# Patient Record
Sex: Female | Born: 1975 | Race: White | Hispanic: No | Marital: Married
Health system: Southern US, Community
[De-identification: ages and names within clinical notes are randomized; demographics above are authoritative.]

## PROBLEM LIST (undated history)

## (undated) HISTORY — PX: HERNIA REPAIR: SHX51

## (undated) HISTORY — PX: WISDOM TOOTH EXTRACTION: SHX21

---

## 2013-02-23 ENCOUNTER — Ambulatory Visit: Payer: Self-pay | Admitting: Family Medicine

## 2013-02-23 LAB — CBC WITH DIFFERENTIAL/PLATELET
Basophil #: 0.1 10*3/uL (ref 0.0–0.1)
Basophil %: 0.8 %
Eosinophil #: 0 10*3/uL (ref 0.0–0.7)
Eosinophil %: 0.3 %
HCT: 40.6 % (ref 35.0–47.0)
Lymphocyte %: 23.1 %
MCHC: 33.4 g/dL (ref 32.0–36.0)
MCV: 94 fL (ref 80–100)
Monocyte %: 6.8 %
Neutrophil #: 5 10*3/uL (ref 1.4–6.5)
RBC: 4.31 10*6/uL (ref 3.80–5.20)

## 2018-01-09 ENCOUNTER — Other Ambulatory Visit: Payer: Self-pay | Admitting: Obstetrics and Gynecology

## 2018-01-09 DIAGNOSIS — Z1231 Encounter for screening mammogram for malignant neoplasm of breast: Secondary | ICD-10-CM

## 2018-01-13 ENCOUNTER — Ambulatory Visit
Admission: RE | Admit: 2018-01-13 | Discharge: 2018-01-13 | Disposition: A | Discharge status: Home / Self Care | Payer: BC Managed Care – PPO | Source: Ambulatory Visit | Attending: Obstetrics and Gynecology | Admitting: Obstetrics and Gynecology

## 2018-01-13 ENCOUNTER — Encounter (INDEPENDENT_AMBULATORY_CARE_PROVIDER_SITE_OTHER): Payer: Self-pay

## 2018-01-13 DIAGNOSIS — Z1231 Encounter for screening mammogram for malignant neoplasm of breast: Secondary | ICD-10-CM | POA: Diagnosis present

## 2019-09-23 ENCOUNTER — Other Ambulatory Visit: Payer: Self-pay | Admitting: Obstetrics and Gynecology

## 2019-09-23 DIAGNOSIS — Z1231 Encounter for screening mammogram for malignant neoplasm of breast: Secondary | ICD-10-CM

## 2019-11-07 IMAGING — MG MM DIGITAL SCREENING BILAT W/ TOMO W/ CAD
8 series · 9 of 24 positions shown · non-contrast
Comparison: Previous exam(s).

CLINICAL DATA: Screening.

EXAM:
DIGITAL SCREENING BILATERAL MAMMOGRAM WITH TOMO AND CAD

[R CC synth-2D]
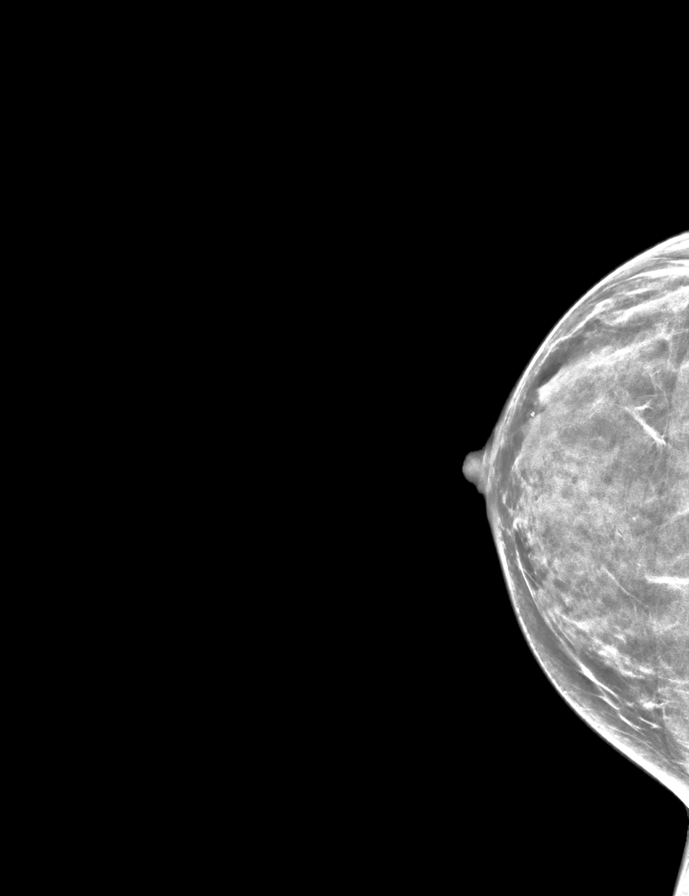

[L MLO synth-2D]
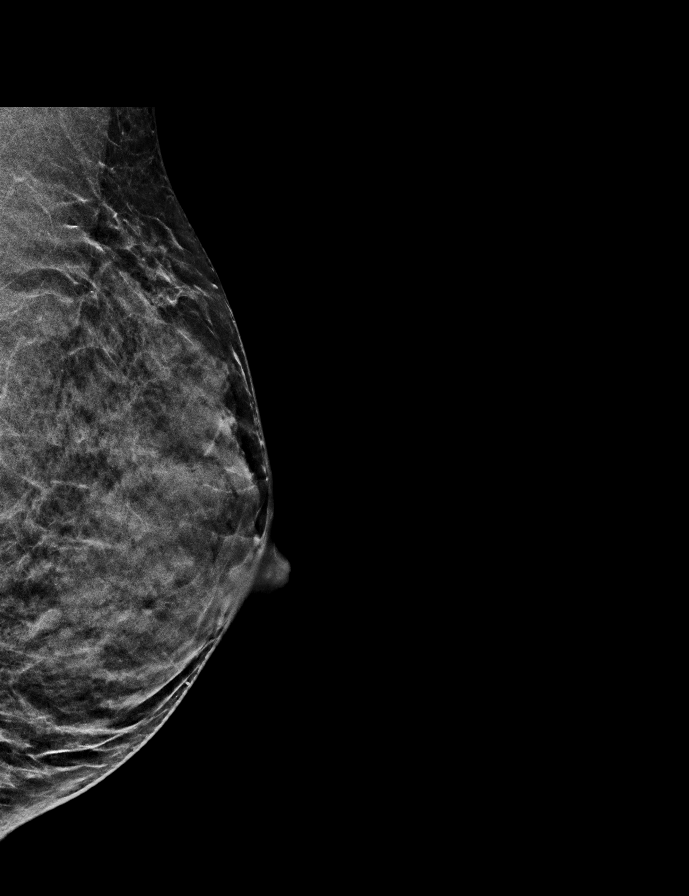

[R MLO synth-2D]
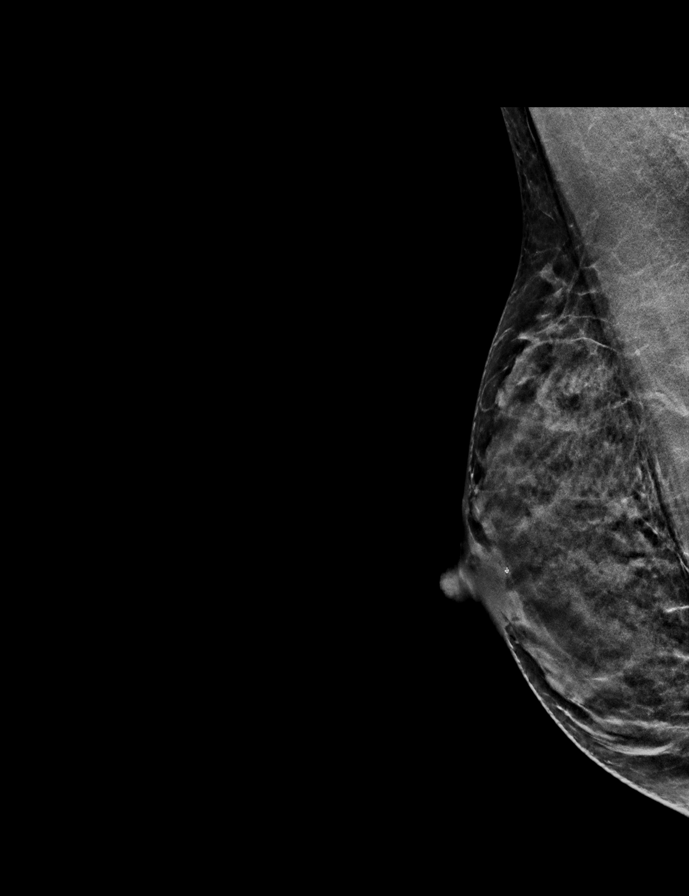

[L CC synth-2D]
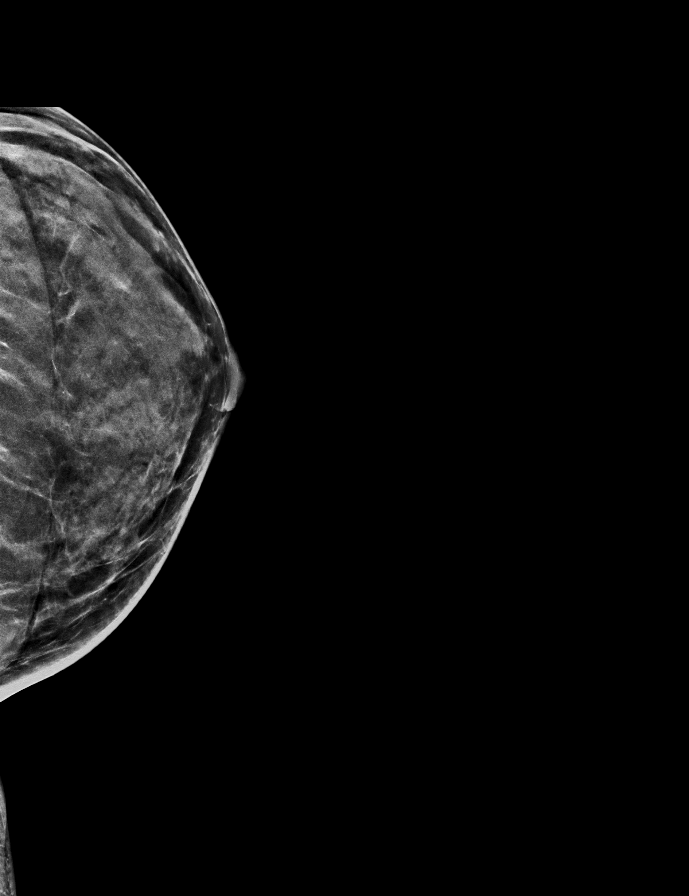

[L MLO tomo · 2 of 39 frames shown]
[frame 13/39]
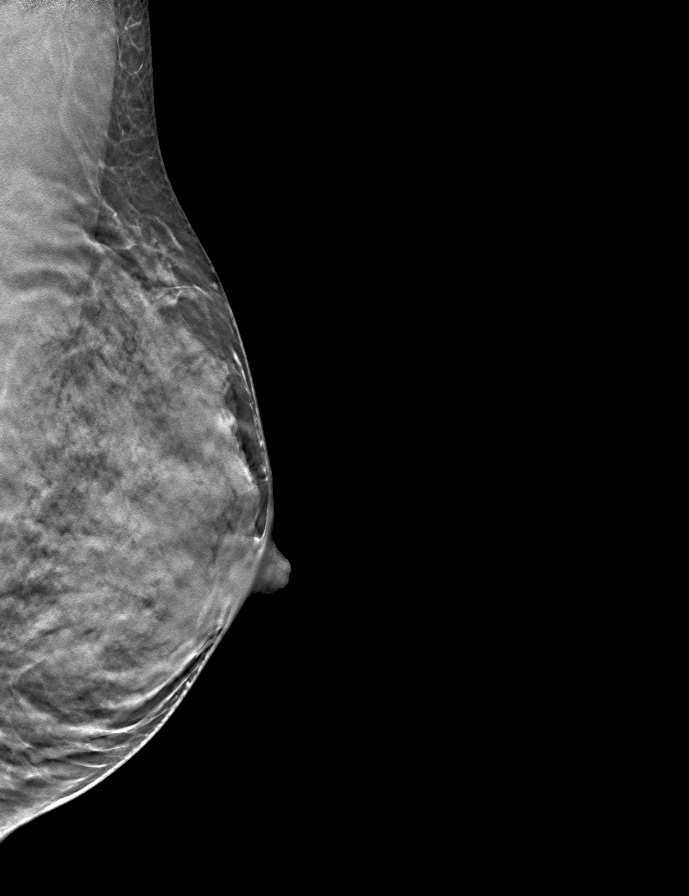
[frame 20/39]
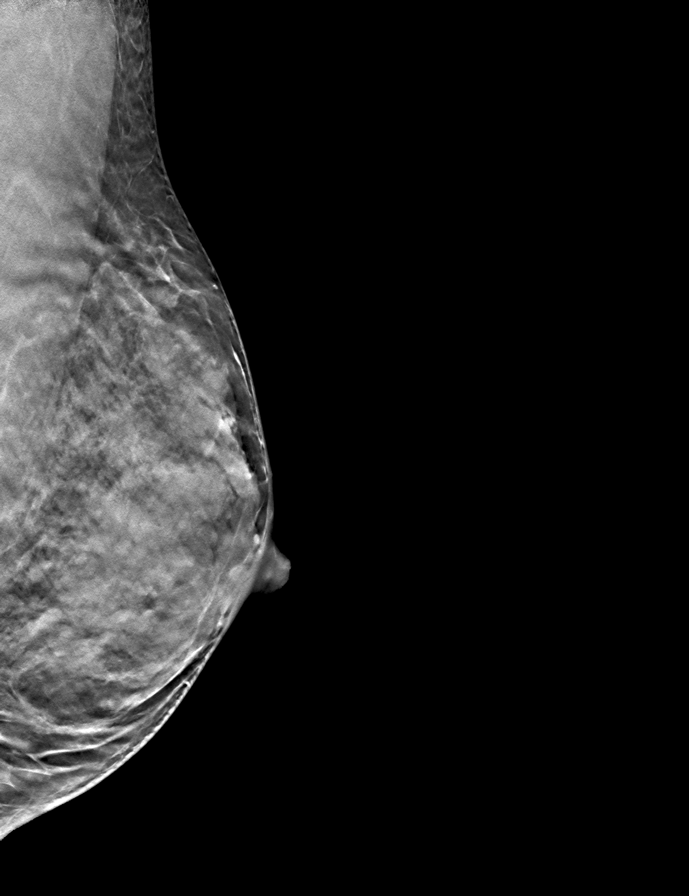

[R CC tomo · tomo slice 24/47.0]
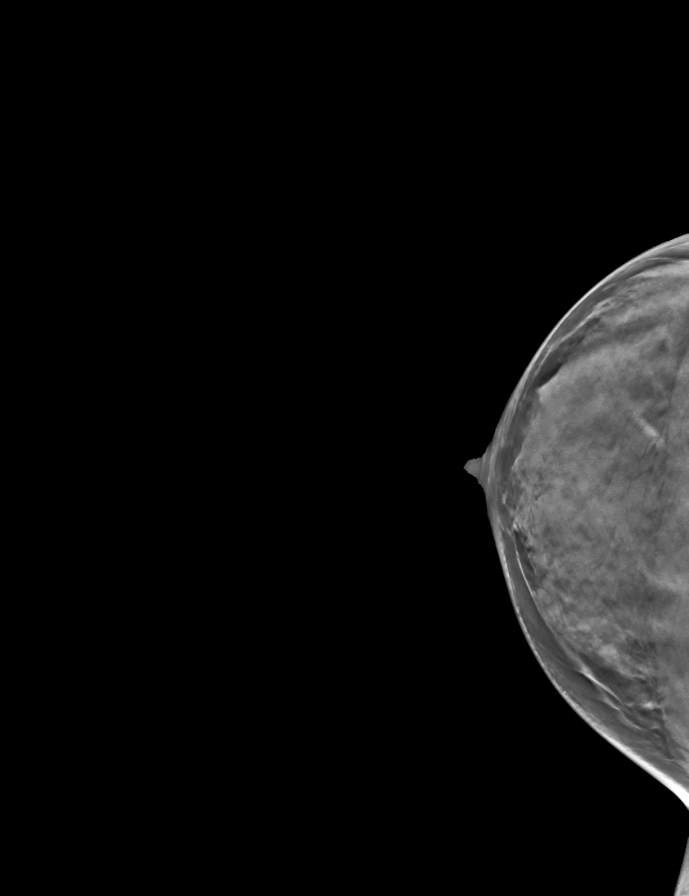

[L CC tomo · tomo slice 25/48.0]
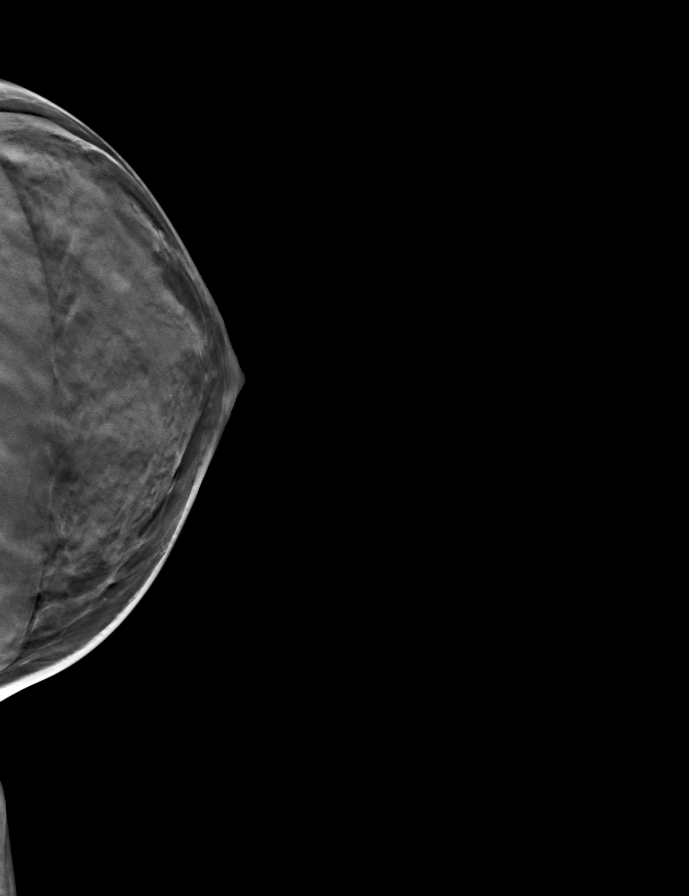

[R MLO tomo · tomo slice 23/45.0]
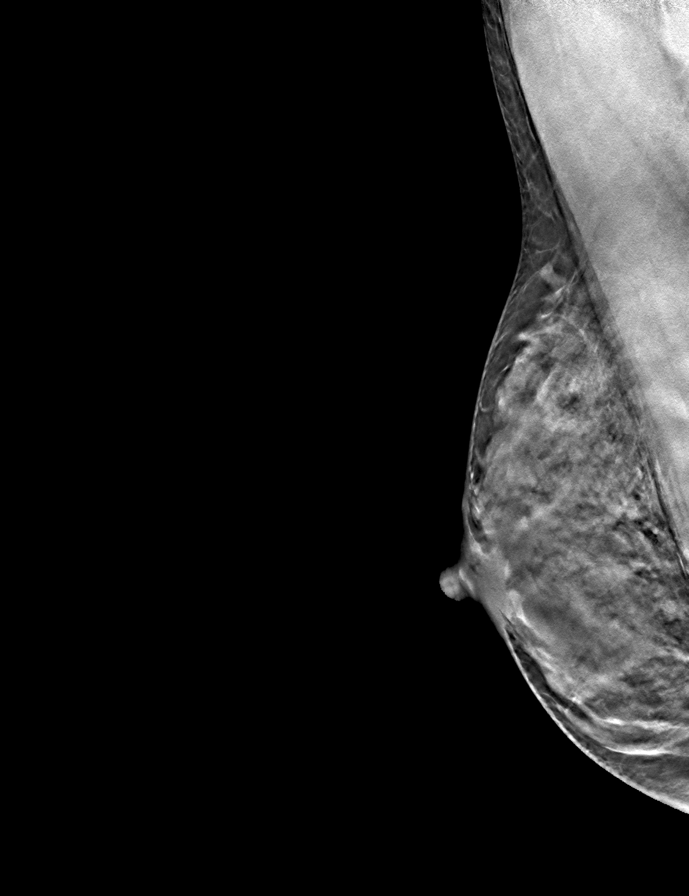

[9 of 24 positions shown; findings below may reference images not displayed]

ACR Breast Density Category d: The breast tissue is extremely dense,
which lowers the sensitivity of mammography.
FINDINGS: There are no findings suspicious for malignancy. Images were
processed with CAD.
IMPRESSION: No mammographic evidence of malignancy. A result letter of this
screening mammogram will be mailed directly to the patient.

RECOMMENDATION:
Screening mammogram in one year. (Code:RA-I-AVB)

BI-RADS CATEGORY  1: Negative.

## 2022-11-14 ENCOUNTER — Other Ambulatory Visit: Payer: Self-pay | Admitting: *Deleted

## 2022-11-14 ENCOUNTER — Telehealth: Payer: Self-pay | Admitting: *Deleted

## 2022-11-14 DIAGNOSIS — Z1211 Encounter for screening for malignant neoplasm of colon: Secondary | ICD-10-CM

## 2022-11-14 NOTE — Telephone Encounter (Signed)
Gastroenterology Pre-Procedure Review  Request Date: 03/10/2023 Requesting Physician: Dr. Allegra Lai  PATIENT REVIEW QUESTIONS: The patient responded to the following health history questions as indicated:    1. Are you having any GI issues? no 2. Do you have a personal history of Polyps? no 3. Do you have a family history of Colon Cancer or Polyps? no 4. Diabetes Mellitus? no 5. Joint replacements in the past 12 months?no 6. Major health problems in the past 3 months?no 7. Any artificial heart valves, MVP, or defibrillator?no    MEDICATIONS & ALLERGIES:    Patient reports the following regarding taking any anticoagulation/antiplatelet therapy:   Plavix, Coumadin, Eliquis, Xarelto, Lovenox, Pradaxa, Brilinta, or Effient? no Aspirin? no  Patient confirms/reports the following medications:  Current Outpatient Medications  Medication Sig Dispense Refill   TRI-ESTARYLLA 0.18/0.215/0.25 MG-35 MCG tablet Take 1 tablet by mouth every morning.     No current facility-administered medications for this visit.    Patient confirms/reports the following allergies:  Not on File  No orders of the defined types were placed in this encounter.   AUTHORIZATION INFORMATION Primary Insurance: 1D#: Group #:  Secondary Insurance: 1D#: Group #:  SCHEDULE INFORMATION: Date: 03/10/2023 Time: Location:  [facility]

## 2023-02-24 ENCOUNTER — Other Ambulatory Visit: Payer: Self-pay | Admitting: *Deleted

## 2023-02-24 MED ORDER — NA SULFATE-K SULFATE-MG SULF 17.5-3.13-1.6 GM/177ML PO SOLN: 1.0000 | Freq: Once | ORAL | 0 refills | Status: AC

## 2023-03-03 ENCOUNTER — Telehealth: Payer: Self-pay | Admitting: Gastroenterology

## 2023-03-03 NOTE — Telephone Encounter (Signed)
Spoken to rep and made her confirm patient's information before I gave the codes.

## 2023-03-03 NOTE — Telephone Encounter (Signed)
Ty called in from [employer] to get the CPT code and Dx code.

## 2023-03-10 ENCOUNTER — Ambulatory Visit: Payer: BC Managed Care – PPO | Admitting: General Practice

## 2023-03-10 ENCOUNTER — Ambulatory Visit
Admission: RE | Admit: 2023-03-10 | Discharge: 2023-03-10 | Disposition: A | Discharge status: Home / Self Care | Payer: BC Managed Care – PPO | Attending: Gastroenterology | Admitting: Gastroenterology

## 2023-03-10 ENCOUNTER — Encounter
Admission: RE | Disposition: A | Discharge status: Home / Self Care | Payer: Self-pay | Source: Home / Self Care | Attending: Gastroenterology

## 2023-03-10 ENCOUNTER — Encounter: Payer: Self-pay | Admitting: Gastroenterology

## 2023-03-10 DIAGNOSIS — Z1211 Encounter for screening for malignant neoplasm of colon: Secondary | ICD-10-CM | POA: Diagnosis not present

## 2023-03-10 DIAGNOSIS — D128 Benign neoplasm of rectum: Secondary | ICD-10-CM | POA: Diagnosis not present

## 2023-03-10 DIAGNOSIS — K621 Rectal polyp: Secondary | ICD-10-CM

## 2023-03-10 HISTORY — PX: COLONOSCOPY WITH PROPOFOL: SHX5780

## 2023-03-10 HISTORY — PX: HOT HEMOSTASIS: SHX5433

## 2023-03-10 HISTORY — PX: POLYPECTOMY: SHX5525

## 2023-03-10 LAB — POCT PREGNANCY, URINE: Preg Test, Ur: NEGATIVE

## 2023-03-10 SURGERY — COLONOSCOPY WITH PROPOFOL
Anesthesia: General

## 2023-03-10 MED ORDER — LIDOCAINE HCL (PF) 2 % IJ SOLN
INTRAMUSCULAR | Status: DC | PRN
  Administered 2023-03-10: 50 mg via INTRADERMAL

## 2023-03-10 MED ORDER — PROPOFOL 500 MG/50ML IV EMUL
INTRAVENOUS | Status: DC | PRN
  Administered 2023-03-10: 100 mg via INTRAVENOUS
  Administered 2023-03-10: 150 ug/kg/min via INTRAVENOUS

## 2023-03-10 MED ORDER — LIDOCAINE HCL (PF) 2 % IJ SOLN
INTRAMUSCULAR | Status: AC
  Filled 2023-03-10: qty 5

## 2023-03-10 MED ORDER — SODIUM CHLORIDE 0.9 % IV SOLN: INTRAVENOUS | Status: DC

## 2023-03-10 MED ORDER — PROPOFOL 1000 MG/100ML IV EMUL
INTRAVENOUS | Status: AC
  Filled 2023-03-10: qty 100

## 2023-03-10 NOTE — Op Note (Signed)
[facility] Gastroenterology Patient Name: Erica Maxwell Procedure Date: 03/10/2023 7:25 AM MRN: [id number] Account #: [id number] Date of Birth: 1976/03/21 Admit Type: Outpatient Age: 47 Room: [facility] ENDO ROOM 4 Gender: Female Note Status: Finalized Instrument Name: Peds Colonoscope [id number] Procedure:             Colonoscopy Indications:           Screening for colorectal malignant neoplasm, This is                         the patient's first colonoscopy Providers:             Toney Reil MD, MD Referring MD:          Toney Reil MD, MD (Referring MD) Medicines:             General Anesthesia Complications:         No immediate complications. Estimated blood loss: None. Procedure:             Pre-Anesthesia Assessment:                        - Prior to the procedure, a History and Physical was                         performed, and patient medications and allergies were                         reviewed. The patient is competent. The risks and                         benefits of the procedure and the sedation options and                         risks were discussed with the patient. All questions                         were answered and informed consent was obtained.                         Patient identification and proposed procedure were                         verified by the physician, the nurse, the                         anesthesiologist, the anesthetist and the technician                         in the pre-procedure area in the procedure room in the                         endoscopy suite. Mental Status Examination: alert and                         oriented. Airway Examination: normal oropharyngeal                         airway and neck mobility. Respiratory Examination:  clear to auscultation. CV Examination: normal.                         Prophylactic Antibiotics: The patient does not require                          prophylactic antibiotics. Prior Anticoagulants: The                         patient has taken no anticoagulant or antiplatelet                         agents. ASA Grade Assessment: I - A normal, healthy                         patient. After reviewing the risks and benefits, the                         patient was deemed in satisfactory condition to                         undergo the procedure. The anesthesia plan was to use                         general anesthesia. Immediately prior to                         administration of medications, the patient was                         re-assessed for adequacy to receive sedatives. The                         heart rate, respiratory rate, oxygen saturations,                         blood pressure, adequacy of pulmonary ventilation, and                         response to care were monitored throughout the                         procedure. The physical status of the patient was                         re-assessed after the procedure.                        After obtaining informed consent, the colonoscope was                         passed under direct vision. Throughout the procedure,                         the patient's blood pressure, pulse, and oxygen                         saturations were monitored continuously. The  Colonoscope was introduced through the anus and                         advanced to the the cecum, identified by appendiceal                         orifice and ileocecal valve. The colonoscopy was                         performed without difficulty. The patient tolerated                         the procedure well. The quality of the bowel                         preparation was evaluated using the BBPS [facility] Bowel                         Preparation Scale) with scores of: Right Colon = 3,                         Transverse Colon = 3 and Left Colon = 3 (entire mucosa                         seen  well with no residual staining, small fragments                         of stool or opaque liquid). The total BBPS score                         equals 9. The ileocecal valve, appendiceal orifice,                         and rectum were photographed. Findings:      The perianal and digital rectal examinations were normal. Pertinent       negatives include normal sphincter tone and no palpable rectal lesions.      A 12 mm polyp was found in the proximal rectum. The polyp was       pedunculated. The polyp was removed with a hot snare. Resection and       retrieval were complete. Estimated blood loss: none.      The retroflexed view of the distal rectum and anal verge was normal and       showed no anal or rectal abnormalities.      The exam was otherwise without abnormality. Impression:            - One 12 mm polyp in the proximal rectum, removed with                         a hot snare. Resected and retrieved.                        - The distal rectum and anal verge are normal on                         retroflexion view.                        -  The examination was otherwise normal. Recommendation:        - Discharge patient to home (with escort).                        - Resume previous diet today.                        - Continue present medications.                        - Await pathology results.                        - Repeat colonoscopy in 3 years for surveillance based                         on pathology results. Procedure Code(s):     --- Professional ---                        [phone], Colonoscopy, flexible; with removal of                         tumor(s), polyp(s), or other lesion(s) by snare                         technique Diagnosis Code(s):     --- Professional ---                        Z12.11, Encounter for screening for malignant neoplasm                         of colon                        D12.8, Benign neoplasm of rectum CPT copyright 2022 American Medical  Association. All rights reserved. The codes documented in this report are preliminary and upon coder review may  be revised to meet current compliance requirements. Dr. Libby Maw Toney Reil MD, MD 03/10/2023 8:14:21 AM This report has been signed electronically. Number of Addenda: 0 Note Initiated On: 03/10/2023 7:25 AM Scope Withdrawal Time: 0 hours 10 minutes 21 seconds  Total Procedure Duration: 0 hours 13 minutes 14 seconds  Estimated Blood Loss:  Estimated blood loss: none.      [facility]

## 2023-03-10 NOTE — Transfer of Care (Signed)
Immediate Anesthesia Transfer of Care Note  Patient: Erica Maxwell  Procedure(s) Performed: COLONOSCOPY WITH PROPOFOL POLYPECTOMY HOT HEMOSTASIS (ARGON PLASMA COAGULATION/BICAP)  Patient Location: PACU  Anesthesia Type:General  Level of Consciousness: awake  Airway & Oxygen Therapy: Patient Spontanous Breathing  Post-op Assessment: Report given to RN and Post -op Vital signs reviewed and stable  Post vital signs: Reviewed and stable  Last Vitals:  Vitals Value Taken Time  BP 90/61 03/10/23 0815  Temp    Pulse 71 03/10/23 0815  Resp 16 03/10/23 0815  SpO2 100 % 03/10/23 0815    Last Pain:  Vitals:   03/10/23 0815  TempSrc:   PainSc: 0-No pain         Complications: There were no known notable events for this encounter.

## 2023-03-10 NOTE — Anesthesia Postprocedure Evaluation (Signed)
Anesthesia Post Note  Patient: Erica Maxwell  Procedure(s) Performed: COLONOSCOPY WITH PROPOFOL POLYPECTOMY HOT HEMOSTASIS (ARGON PLASMA COAGULATION/BICAP)  Patient location during evaluation: Endoscopy Anesthesia Type: General Level of consciousness: awake and alert Pain management: pain level controlled Vital Signs Assessment: post-procedure vital signs reviewed and stable Respiratory status: spontaneous breathing, nonlabored ventilation, respiratory function stable and patient connected to nasal cannula oxygen Cardiovascular status: blood pressure returned to baseline and stable Postop Assessment: no apparent nausea or vomiting Anesthetic complications: no  There were no known notable events for this encounter.   Last Vitals:  Vitals:   03/10/23 0815 03/10/23 0825  BP: 90/61 102/67  Pulse: 71 63  Resp: 16 18  Temp:  (!) 35.6 C  SpO2: 100% 100%    Last Pain:  Vitals:   03/10/23 0825  TempSrc: Temporal  PainSc: 0-No pain                 Stephanie Coup

## 2023-03-10 NOTE — Addendum Note (Signed)
Addendum  created 03/10/23 0921 by Lysbeth Penner, CRNA   Flowsheet accepted, Intraprocedure Flowsheets edited

## 2023-03-10 NOTE — Anesthesia Preprocedure Evaluation (Signed)
Anesthesia Evaluation  Patient identified by MRN, date of birth, ID band Patient awake    Reviewed: Allergy & Precautions, NPO status , Patient's Chart, lab work & pertinent test results  Airway Mallampati: III  TM Distance: >3 FB Neck ROM: full    Dental  (+) Chipped   Pulmonary neg pulmonary ROS   Pulmonary exam normal        Cardiovascular negative cardio ROS Normal cardiovascular exam     Neuro/Psych negative neurological ROS  negative psych ROS   GI/Hepatic negative GI ROS, Neg liver ROS,,,  Endo/Other  negative endocrine ROS    Renal/GU negative Renal ROS  negative genitourinary   Musculoskeletal   Abdominal   Peds  Hematology negative hematology ROS (+)   Anesthesia Other Findings History reviewed. No pertinent past medical history.  Past Surgical History: No date: HERNIA REPAIR No date: WISDOM TOOTH EXTRACTION  BMI    Body Mass Index: 21.61 kg/m      Reproductive/Obstetrics negative OB ROS                             Anesthesia Physical Anesthesia Plan  ASA: 1  Anesthesia Plan: General   Post-op Pain Management:    Induction: Intravenous  PONV Risk Score and Plan: Propofol infusion, TIVA and Ondansetron  Airway Management Planned: Natural Airway and Nasal Cannula  Additional Equipment:   Intra-op Plan:   Post-operative Plan:   Informed Consent: I have reviewed the patients History and Physical, chart, labs and discussed the procedure including the risks, benefits and alternatives for the proposed anesthesia with the patient or authorized representative who has indicated his/her understanding and acceptance.     Dental Advisory Given  Plan Discussed with: Anesthesiologist, CRNA and Surgeon  Anesthesia Plan Comments: (Patient consented for risks of anesthesia including but not limited to:  - adverse reactions to medications - risk of airway placement if  required - damage to eyes, teeth, lips or other oral mucosa - nerve damage due to positioning  - sore throat or hoarseness - Damage to heart, brain, nerves, lungs, other parts of body or loss of life  Patient voiced understanding and assent.)       Anesthesia Quick Evaluation

## 2023-03-10 NOTE — H&P (Signed)
  Arlyss Repress, MD [address]  [address]  [place], [place] [address]  Main: [phone]  Fax: [phone] Pager: [phone]  Primary Care Physician:  Cassell Clement, MD Primary Gastroenterologist:  Dr. Arlyss Repress  Pre-Procedure History & Physical: HPI:  Erica Maxwell is a 47 y.o. female is here for an colonoscopy.   History reviewed. No pertinent past medical history.  Past Surgical History:  Procedure Laterality Date   HERNIA REPAIR     WISDOM TOOTH EXTRACTION      Prior to Admission medications   Medication Sig Start Date End Date Taking? Authorizing Provider  TRI-ESTARYLLA 0.18/0.215/0.25 MG-35 MCG tablet Take 1 tablet by mouth every morning.   Yes [provider]    Allergies as of 11/14/2022   (Not on File)    Family History  Problem Relation Age of Onset   Breast cancer Neg Hx     Social History   Socioeconomic History   Marital status: Married    Spouse name: Not on file   Number of children: Not on file   Years of education: Not on file   Highest education level: Not on file  Occupational History   Not on file  Tobacco Use   Smoking status: Never   Smokeless tobacco: Never  Vaping Use   Vaping status: Never Used  Substance and Sexual Activity   Alcohol use: Yes   Drug use: Never   Sexual activity: Not on file  Other Topics Concern   Not on file  Social History Narrative   Not on file   Social Determinants of Health   Financial Resource Strain: Not on file  Food Insecurity: Not on file  Transportation Needs: Not on file  Physical Activity: Not on file  Stress: Not on file  Social Connections: Not on file  Intimate Partner Violence: Not on file    Review of Systems: See HPI, otherwise negative ROS  Physical Exam: BP 124/73   Pulse 65   Temp (!) 96.8 F (36 C) (Temporal)   Resp 16   Ht 5\' 3"  (1.6 m)   Wt 55.3 kg   SpO2 100%   BMI 21.61 kg/m  General:   Alert,  pleasant and cooperative in  NAD Head:  Normocephalic and atraumatic. Neck:  Supple; no masses or thyromegaly. Lungs:  Clear throughout to auscultation.    Heart:  Regular rate and rhythm. Abdomen:  Soft, nontender and nondistended. Normal bowel sounds, without guarding, and without rebound.   Neurologic:  Alert and  oriented x4;  grossly normal neurologically.  Impression/Plan: Erica Maxwell is here for a colonoscopy to be performed for colon cancer screening  Risks, benefits, limitations, and alternatives regarding  colonoscopy have been reviewed with the patient.  Questions have been answered.  All parties agreeable.   Lannette Donath, MD  03/10/2023, 7:45 AM

## 2023-03-11 ENCOUNTER — Encounter: Payer: Self-pay | Admitting: Gastroenterology

## 2023-03-12 ENCOUNTER — Encounter: Payer: Self-pay | Admitting: Gastroenterology

## 2023-03-12 LAB — SURGICAL PATHOLOGY

## 2023-09-25 ENCOUNTER — Ambulatory Visit
Admission: EM | Admit: 2023-09-25 | Discharge: 2023-09-25 | Disposition: A | Discharge status: Home / Self Care | Attending: Physician Assistant | Admitting: Physician Assistant

## 2023-09-25 DIAGNOSIS — R0981 Nasal congestion: Secondary | ICD-10-CM | POA: Insufficient documentation

## 2023-09-25 DIAGNOSIS — J029 Acute pharyngitis, unspecified: Secondary | ICD-10-CM | POA: Diagnosis present

## 2023-09-25 DIAGNOSIS — R21 Rash and other nonspecific skin eruption: Secondary | ICD-10-CM | POA: Diagnosis present

## 2023-09-25 LAB — GROUP A STREP BY PCR: Group A Strep by PCR: NOT DETECTED

## 2023-09-25 MED ORDER — TRIAMCINOLONE ACETONIDE 0.1 % EX OINT: 1.0000 | TOPICAL_OINTMENT | Freq: Two times a day (BID) | CUTANEOUS | 0 refills | Status: AC

## 2023-09-25 NOTE — Discharge Instructions (Signed)
 URI/COLD SYMPTOMS: Your exam today is consistent with a viral illness vs allergies. Antibiotics are not indicated at this time. Use medications as directed, including cough syrup, nasal saline, and decongestants. Your symptoms should improve over the next few days and resolve within 7-10 days. Increase rest and fluids. F/u if symptoms worsen or predominate such as sore throat, ear pain, productive cough, shortness of breath, or if you develop high fevers or worsening fatigue over the next several days.    RASH: Likely eczema. Try the triamcinolone ointment. Moisturize regularly

## 2023-09-25 NOTE — ED Provider Notes (Signed)
 [facility]    CSN: [id number] Arrival date & time: 09/25/23  1804      History   Chief Complaint Chief Complaint  Patient presents with   Rash    HPI Erica Maxwell is a 48 y.o. female presenting for sore throat and slight congestion x 3 days.  She denies fever, fatigue, cough, shortness of breath.  No sick contacts.  Patient also reports rash of bilateral upper arms for the past week.  She states the rash is a little bit itchy but denies pain.  Has applied hydrocortisone cream and take Benadryl but does not think it helped.  No history of similar rashes.  HPI  History reviewed. No pertinent past medical history.  Patient Active Problem List   Diagnosis Date Noted   Screening for colon cancer 03/10/2023   Polyp of rectum 03/10/2023    Past Surgical History:  Procedure Laterality Date   COLONOSCOPY WITH PROPOFOL  N/A 03/10/2023   Procedure: COLONOSCOPY WITH PROPOFOL ;  Surgeon: Selena Daily, MD;  Location: [facility] ENDOSCOPY;  Service: Gastroenterology;  Laterality: N/A;   HERNIA REPAIR     HOT HEMOSTASIS  03/10/2023   Procedure: HOT HEMOSTASIS (ARGON PLASMA COAGULATION/BICAP);  Surgeon: Selena Daily, MD;  Location: [facility] ENDOSCOPY;  Service: Gastroenterology;;   POLYPECTOMY  03/10/2023   Procedure: POLYPECTOMY;  Surgeon: Selena Daily, MD;  Location: [facility] ENDOSCOPY;  Service: Gastroenterology;;   WISDOM TOOTH EXTRACTION      OB History   No obstetric history on file.      Home Medications    Prior to Admission medications   Medication Sig Start Date End Date Taking? Authorizing Provider  TRI-ESTARYLLA 0.18/0.215/0.25 MG-35 MCG tablet Take 1 tablet by mouth every morning.   Yes [provider]  triamcinolone ointment (KENALOG) 0.1 % Apply 1 Application topically 2 (two) times daily. 09/25/23  Yes Floydene Hy, PA-C    Family History Family History  Problem Relation Age of Onset   Breast cancer Neg Hx     Social  History Social History   Tobacco Use   Smoking status: Never   Smokeless tobacco: Never  Vaping Use   Vaping status: Never Used  Substance Use Topics   Alcohol use: Yes   Drug use: Never     Allergies   Patient has no known allergies.   Review of Systems Review of Systems  Constitutional:  Negative for chills, diaphoresis, fatigue and fever.  HENT:  Positive for congestion, rhinorrhea and sore throat. Negative for ear pain, sinus pressure and sinus pain.   Respiratory:  Negative for cough and shortness of breath.   Gastrointestinal:  Negative for abdominal pain, nausea and vomiting.  Musculoskeletal:  Negative for arthralgias and myalgias.  Skin:  Positive for rash.  Neurological:  Negative for weakness and headaches.  Hematological:  Negative for adenopathy.     Physical Exam Triage Vital Signs ED Triage Vitals  Encounter Vitals Group     BP 09/25/23 1822 114/77     Girls Systolic BP Percentile --      Girls Diastolic BP Percentile --      Boys Systolic BP Percentile --      Boys Diastolic BP Percentile --      Pulse Rate 09/25/23 1822 66     Resp --      Temp 09/25/23 1822 98.9 F (37.2 C)     Temp Source 09/25/23 1822 Oral     SpO2 09/25/23 1822 98 %  Weight 09/25/23 1821 120 lb (54.4 kg)     Height 09/25/23 1821 5' 3 (1.6 m)     Head Circumference --      Peak Flow --      Pain Score 09/25/23 1820 3     Pain Loc --      Pain Education --      Exclude from Growth Chart --    No data found.  Updated Vital Signs BP 114/77 (BP Location: Left Arm)   Pulse 66   Temp 98.9 F (37.2 C) (Oral)   Ht 5' 3 (1.6 m)   Wt 120 lb (54.4 kg)   LMP 09/23/2023   SpO2 98%   BMI 21.26 kg/m      Physical Exam Vitals and nursing note reviewed.  Constitutional:      General: She is not in acute distress.    Appearance: Normal appearance. She is not ill-appearing or toxic-appearing.  HENT:     Head: Normocephalic and atraumatic.     Nose: Congestion  present.     Mouth/Throat:     Mouth: Mucous membranes are moist.     Pharynx: Oropharynx is clear. Posterior oropharyngeal erythema present.   Eyes:     General: No scleral icterus.       Right eye: No discharge.        Left eye: No discharge.     Conjunctiva/sclera: Conjunctivae normal.    Cardiovascular:     Rate and Rhythm: Normal rate and regular rhythm.     Heart sounds: Normal heart sounds.  Pulmonary:     Effort: Pulmonary effort is normal. No respiratory distress.     Breath sounds: Normal breath sounds.   Musculoskeletal:     Cervical back: Neck supple.   Skin:    General: Skin is dry.     Findings: Rash (multiple small areas of dry patchy erythematous rash of bilateral  upper arms and AC regions) present.   Neurological:     General: No focal deficit present.     Mental Status: She is alert. Mental status is at baseline.     Motor: No weakness.     Gait: Gait normal.   Psychiatric:        Mood and Affect: Mood normal.        Behavior: Behavior normal.      UC Treatments / Results  Labs (all labs ordered are listed, but only abnormal results are displayed) Labs Reviewed  GROUP A STREP BY PCR    EKG   Radiology No results found.  Procedures Procedures (including critical care time)  Medications Ordered in UC Medications - No data to display  Initial Impression / Assessment and Plan / UC Course  I have reviewed the triage vital signs and the nursing notes.  Pertinent labs & imaging results that were available during my care of the patient were reviewed by me and considered in my medical decision making (see chart for details).   48 y/o female presents for sore throat and congestion x 3 days. No fever, cough, fatigue. No sick contacts. Also reporting rash of bilateral arms x 1 week.   VSS and patient is overall well appearing. NAD. On exam, she has nasal congestion and mild posterior pharyngeal erythema with PND. Chest is clear and heart RRR.  Patchy erythematous dry rash of both upper extremities.  PCR strep is negative.  Viral illness vs allergies. Advised use of OTC decongestants and antihistamines, chloraseptic spray and throat  lozenges.   Rash consistent with eczema. Treating with triamcinolone ointment. Advised moisturizers and try to identify and avoid triggers. Reviewed return precautions.   Final Clinical Impressions(s) / UC Diagnoses   Final diagnoses:  Rash and nonspecific skin eruption  Sore throat  Nasal congestion     Discharge Instructions      URI/COLD SYMPTOMS: Your exam today is consistent with a viral illness vs allergies. Antibiotics are not indicated at this time. Use medications as directed, including cough syrup, nasal saline, and decongestants. Your symptoms should improve over the next few days and resolve within 7-10 days. Increase rest and fluids. F/u if symptoms worsen or predominate such as sore throat, ear pain, productive cough, shortness of breath, or if you develop high fevers or worsening fatigue over the next several days.    RASH: Likely eczema. Try the triamcinolone ointment. Moisturize regularly   ED Prescriptions     Medication Sig Dispense Auth. Provider   triamcinolone ointment (KENALOG) 0.1 % Apply 1 Application topically 2 (two) times daily. 30 g Shaunna [place]      PDMP not reviewed this encounter.   Floydene Hy, PA-C 09/25/23 1931

## 2023-09-25 NOTE — ED Triage Notes (Signed)
 Pt c/o sore throat, rash on upper arm from shoulder to elbow x1week  Pt states that she was playing pickleball last week and was in the sun before she noticed the rash.   Pt is unsure if she is sunburnt.
